# Patient Record
Sex: Male | Born: 1985 | Race: Black or African American | Hispanic: No | Marital: Single | Smoking: Never smoker
Health system: Southern US, Community
[De-identification: ages and names within clinical notes are randomized; demographics above are authoritative.]

---

## 2012-06-03 ENCOUNTER — Ambulatory Visit: Payer: BC Managed Care – PPO | Admitting: Physician Assistant

## 2012-06-03 VITALS — BP 124/74 | HR 89 | Temp 98.3°F | Resp 16 | Ht 66.0 in | Wt 174.0 lb

## 2012-06-03 DIAGNOSIS — J029 Acute pharyngitis, unspecified: Secondary | ICD-10-CM

## 2012-06-03 DIAGNOSIS — J069 Acute upper respiratory infection, unspecified: Secondary | ICD-10-CM

## 2012-06-03 MED ORDER — GUAIFENESIN ER 1200 MG PO TB12: 1.0000 | ORAL_TABLET | Freq: Two times a day (BID) | ORAL | Status: AC

## 2012-06-03 MED ORDER — MAGIC MOUTHWASH W/LIDOCAINE: ORAL | Status: AC

## 2012-06-03 MED ORDER — IPRATROPIUM BROMIDE 0.06 % NA SOLN: 2.0000 | Freq: Three times a day (TID) | NASAL | Status: AC

## 2012-06-03 NOTE — Progress Notes (Signed)
  Subjective:    Patient ID: Jason Mejia, male    DOB: 1986-07-23, 26 y.o.   MRN: [id number]  HPI  Pt presents to clinic with 1 wk h/o cold symptoms.  He started with congestion and chills.  But a couple of days ago started to develop a sore throat and now he is worried about strep.  He has seasonal allergies but this feels different.  He has used a couple of doses of Nyquil without much help/  Has noticed decrease in eating because of the pain in his throat.  His son was sick last week and he works in [employer] ge[employer].   Review of Systems  Constitutional: Positive for chills. Negative for fever.  HENT: Positive for congestion, sore throat (worse in the am - ), rhinorrhea and postnasal drip.   Respiratory: Negative for cough (resolved).   Gastrointestinal: Negative for nausea.  Neurological: Negative for headaches.       Objective:   Physical Exam  Vitals reviewed. Constitutional: He is oriented to person, place, and time. He appears well-developed and well-nourished.  HENT:  Head: Normocephalic and atraumatic.  Right Ear: Hearing, tympanic membrane, external ear and ear canal normal.  Left Ear: Hearing, tympanic membrane, external ear and ear canal normal.  Nose: Mucosal edema (red and swollen) present.  Mouth/Throat: Uvula is midline. Posterior oropharyngeal edema and posterior oropharyngeal erythema present.       Hypertrophic tonsils with bilateral tonsoliths.  Eyes: Conjunctivae normal are normal.  Neck: Neck supple.  Cardiovascular: Normal rate, regular rhythm and normal heart sounds.   No murmur heard. Pulmonary/Chest: Effort normal and breath sounds normal.  Lymphadenopathy:    He has no cervical adenopathy.  Neurological: He is alert and oriented to person, place, and time.  Skin: Skin is warm and dry.  Psychiatric: He has a normal mood and affect. His behavior is normal. Judgment and thought content normal.    Results for orders placed in visit on 06/03/12  POCT RAPID STREP A  (OFFICE)      Component Value Range   Rapid Strep A Screen Negative  Negative         Assessment & Plan:   1. Acute URI  Guaifenesin (MUCINEX MAXIMUM STRENGTH) 1200 MG TB12, ipratropium (ATROVENT) 0.06 % nasal spray  2. Sore throat  POCT rapid strep A, Alum & Mag Hydroxide-Simeth (MAGIC MOUTHWASH W/LIDOCAINE) SOLN

## 2012-06-03 NOTE — Patient Instructions (Signed)
Push fluids. Tylenol/motrin prn myalgias and sore throat pain. Gargle to remove tonsoliths. Call in 1 wk if no better for antibiotics because then a sinus infection is probable.

## 2016-06-09 ENCOUNTER — Emergency Department
Admission: EM | Admit: 2016-06-09 | Discharge: 2016-06-09 | Disposition: A | Discharge status: Home / Self Care | Payer: Self-pay | Attending: Emergency Medicine | Admitting: Emergency Medicine

## 2016-06-09 ENCOUNTER — Emergency Department: Payer: Self-pay

## 2016-06-09 DIAGNOSIS — S61412A Laceration without foreign body of left hand, initial encounter: Secondary | ICD-10-CM

## 2016-06-09 DIAGNOSIS — Y9389 Activity, other specified: Secondary | ICD-10-CM | POA: Insufficient documentation

## 2016-06-09 DIAGNOSIS — S61012A Laceration without foreign body of left thumb without damage to nail, initial encounter: Secondary | ICD-10-CM | POA: Insufficient documentation

## 2016-06-09 DIAGNOSIS — S61219A Laceration without foreign body of unspecified finger without damage to nail, initial encounter: Secondary | ICD-10-CM

## 2016-06-09 DIAGNOSIS — Y9289 Other specified places as the place of occurrence of the external cause: Secondary | ICD-10-CM | POA: Insufficient documentation

## 2016-06-09 DIAGNOSIS — W25XXXA Contact with sharp glass, initial encounter: Secondary | ICD-10-CM | POA: Insufficient documentation

## 2016-06-09 DIAGNOSIS — Y999 Unspecified external cause status: Secondary | ICD-10-CM | POA: Insufficient documentation

## 2016-06-09 DIAGNOSIS — S61411A Laceration without foreign body of right hand, initial encounter: Secondary | ICD-10-CM | POA: Insufficient documentation

## 2016-06-09 MED ORDER — PENTAFLUOROPROP-TETRAFLUOROETH EX AERO
INHALATION_SPRAY | CUTANEOUS | Status: AC
  Filled 2016-06-09: qty 30

## 2016-06-09 MED ORDER — LIDOCAINE HCL (PF) 1 % IJ SOLN
INTRAMUSCULAR | Status: AC
  Filled 2016-06-09: qty 5

## 2016-06-09 MED ORDER — CEPHALEXIN 500 MG PO CAPS: 500.0000 mg | ORAL_CAPSULE | Freq: Two times a day (BID) | ORAL | 0 refills | Status: AC

## 2016-06-09 MED ORDER — BACITRACIN ZINC 500 UNIT/GM EX OINT
TOPICAL_OINTMENT | CUTANEOUS | Status: AC
  Filled 2016-06-09: qty 1.8

## 2016-06-09 MED ORDER — OXYCODONE-ACETAMINOPHEN 5-325 MG PO TABS
1.0000 | ORAL_TABLET | Freq: Once | ORAL | Status: AC
  Administered 2016-06-09: 1 via ORAL

## 2016-06-09 MED ORDER — CEPHALEXIN 500 MG PO CAPS
500.0000 mg | ORAL_CAPSULE | Freq: Once | ORAL | Status: AC
  Administered 2016-06-09: 500 mg via ORAL
  Filled 2016-06-09: qty 1

## 2016-06-09 MED ORDER — OXYCODONE-ACETAMINOPHEN 5-325 MG PO TABS
ORAL_TABLET | ORAL | Status: AC
  Administered 2016-06-09: 1 via ORAL
  Filled 2016-06-09: qty 1

## 2016-06-09 NOTE — ED Triage Notes (Addendum)
Pt reports to ED w/ finger laceration.  Sts that he was reaching for fallen cup and cut L and R hand.  Pt denies CP, SOB, N/V/D, LOC or falls.  Pt alert and oriented x 4.  Pts wounds cleansed and dressed.  R hand edges approximate, L thumb tip does not

## 2016-06-09 NOTE — ED Provider Notes (Signed)
[facility] Emergency Department Provider Note   First MD Initiated Contact with Patient 06/09/16 [phone]     (approximate)  I have reviewed the triage vital signs and the nursing notes.   HISTORY  Chief Complaint Laceration    HPI Jason Mejia is a 30 y.o. male presents with right palm and left thumb laceration which was sustained secondary to broken glass. Patient states current pain score is 10 out of 10.   Past medical history None There are no active problems to display for this patient.   Past surgical history None  Prior to Admission medications   Medication Sig Start Date End Date Taking? Authorizing Provider  Alum & Mag Hydroxide-Simeth (MAGIC MOUTHWASH W/LIDOCAINE) SOLN 1 tsp po q1-2h prn sore throat 06/03/12   Morrell Riddle, PA-C  Guaifenesin [facility] MAXIMUM STRENGTH) 1200 MG TB12 Take 1 tablet (1,200 mg total) by mouth 2 (two) times daily. 06/03/12   Morrell Riddle, PA-C  ipratropium (ATROVENT) 0.06 % nasal spray Place 2 sprays into the nose 3 (three) times daily. 06/03/12   Morrell Riddle, PA-C    Allergies No known drug allergies No family history on file.  Social History Social History  Substance Use Topics  . Smoking status: Never Smoker  . Smokeless tobacco: Never Used  . Alcohol use No    Review of Systems Constitutional: No fever/chills Eyes: No visual changes. ENT: No sore throat. Cardiovascular: Denies chest pain. Respiratory: Denies shortness of breath. Gastrointestinal: No abdominal pain.  No nausea, no vomiting.  No diarrhea.  No constipation. Genitourinary: Negative for dysuria. Musculoskeletal: Negative for back pain. Skin: Negative for rash.Positive for right palm and left thumb lacerations Neurological: Negative for headaches, focal weakness or numbness.  10-point ROS otherwise negative.  ____________________________________________   PHYSICAL EXAM:  VITAL SIGNS: ED Triage Vitals  Enc Vitals Group     BP  06/09/16 0210 133/83     Pulse Rate 06/09/16 0210 67     Resp 06/09/16 0210 18     Temp 06/09/16 0210 98.2 F (36.8 C)     Temp Source 06/09/16 0210 Oral     SpO2 06/09/16 0210 100 %     Weight 06/09/16 0210 175 lb (79.4 kg)     Height 06/09/16 0210 5\' 8"  (1.727 m)     Head Circumference --      Peak Flow --      Pain Score 06/09/16 0211 9     Pain Loc --      Pain Edu? --      Excl. in GC? --     Constitutional: Alert and oriented. Well appearing and in no acute distress. Eyes: Conjunctivae are normal. PERRL. EOMI. Head: Atraumatic. Mouth/Throat: Mucous membranes are moist.  Oropharynx non-erythematous. Neck: No stridor.  No meningeal signs.   Musculoskeletal: No lower extremity tenderness nor edema. No gross deformities of extremities. Neurologic:  Normal speech and language. No gross focal neurologic deficits are appreciated.  Skin:  3 cm knee laceration right palm. 1 cm Skin avulsion noted distal left thumb actively bleeding Psychiatric: Mood and affect are normal. Speech and behavior are normal.  ___________________________________  RADIOLOGY I, Willacy N Rachel Rison, personally viewed and evaluated these images (plain radiographs) as part of my medical decision making, as well as reviewing the written report by the radiologist.  Dg Hand Complete Left  Result Date: 06/09/2016 CLINICAL DATA:  Lacerations to the left thumb from broken glass. EXAM: LEFT HAND - COMPLETE 3+  VIEW COMPARISON:  None. FINDINGS: Gauze material over the left first finger may obscure detail. No radiopaque soft tissue foreign bodies are demonstrated. No acute fracture or dislocation. No focal bone lesion or bone destruction. Bone cortex appears intact. IMPRESSION: Negative. Electronically Signed   By: Burman NievesWilliam  Stevens M.D.   On: 06/09/2016 02:44   Dg Hand Complete Right  Result Date: 06/09/2016 CLINICAL DATA:  Multiple lacerations to the right hand from broken glass. EXAM: RIGHT HAND - COMPLETE 3+ VIEW  COMPARISON:  None. FINDINGS: No radiopaque soft tissue foreign bodies demonstrated. There is no evidence of fracture or dislocation. There is no evidence of arthropathy or other focal bone abnormality. Soft tissues are unremarkable. IMPRESSION: Negative. Electronically Signed   By: Burman NievesWilliam  Stevens M.D.   On: 06/09/2016 02:45    ____________________________________________   PROCEDURES   .Marland Kitchen.Laceration Repair Date/Time: 06/09/2016 7:51 AM Performed by: Darci CurrentBROWN, [place] N Authorized by: Darci CurrentBROWN,  N   Consent:    Consent obtained:  Verbal   Consent given by:  Patient   Risks discussed:  Infection and pain   Alternatives discussed:  No treatment Anesthesia (see MAR for exact dosages):    Anesthesia method:  Local infiltration   Local anesthetic:  Lidocaine 1% w/o epi Laceration details:    Location:  Hand   Hand location:  R palm   Length (cm):  3   Depth (mm):  1 Repair type:    Repair type:  Simple Pre-procedure details:    Preparation:  Patient was prepped and draped in usual sterile fashion Exploration:    Contaminated: no   Treatment:    Area cleansed with:  Saline and Betadine   Amount of cleaning:  Standard   Visualized foreign bodies/material removed: no   Skin repair:    Repair method:  Sutures   Suture size:  5-0   Suture material:  Nylon   Number of sutures:  5 Approximation:    Approximation:  Close Post-procedure details:    Patient tolerance of procedure:  Tolerated well, no immediate complications        INITIAL IMPRESSION / ASSESSMENT AND PLAN / ED COURSE  Pertinent labs & imaging results that were available during my care of the patient were reviewed by me and considered in my medical decision making (see chart for details).   Left thumb laceration not repaired secondary to avulsion injury with inability to close remaining skin border Patient given Keflex for prophylaxis   Clinical Course     ____________________________________________  FINAL CLINICAL IMPRESSION(S) / ED DIAGNOSES  Final diagnoses:  Hand laceration, left, initial encounter  Finger laceration, initial encounter     MEDICATIONS GIVEN DURING THIS VISIT:  Medications  lidocaine (PF) (XYLOCAINE) 1 % injection (not administered)  pentafluoroprop-tetrafluoroeth (GEBAUERS) aerosol (not administered)     NEW OUTPATIENT MEDICATIONS STARTED DURING THIS VISIT:  New Prescriptions   No medications on file    Modified Medications   No medications on file    Discontinued Medications   No medications on file     Note:  This document was prepared using Dragon voice recognition software and may include unintentional dictation errors.    Darci Currentandolph N Liahna Brickner, MD 06/09/16 [phone]

## 2016-06-09 NOTE — ED Notes (Signed)
Reviewed d/c instructions, follow-up care, prescriptions, and suture/wound care with pt. Pt verbalized understanding

## 2018-03-28 IMAGING — CR DG HAND COMPLETE 3+V*R*
1 series · 3 of 3 positions shown · non-contrast
Comparison: None.

CLINICAL DATA: Multiple lacerations to the right hand from broken
glass.

EXAM:
RIGHT HAND - COMPLETE 3+ VIEW

[Series 1: x hand pa right · 0.14mm/px · 3 of 3 slices shown]
[im 1/3]
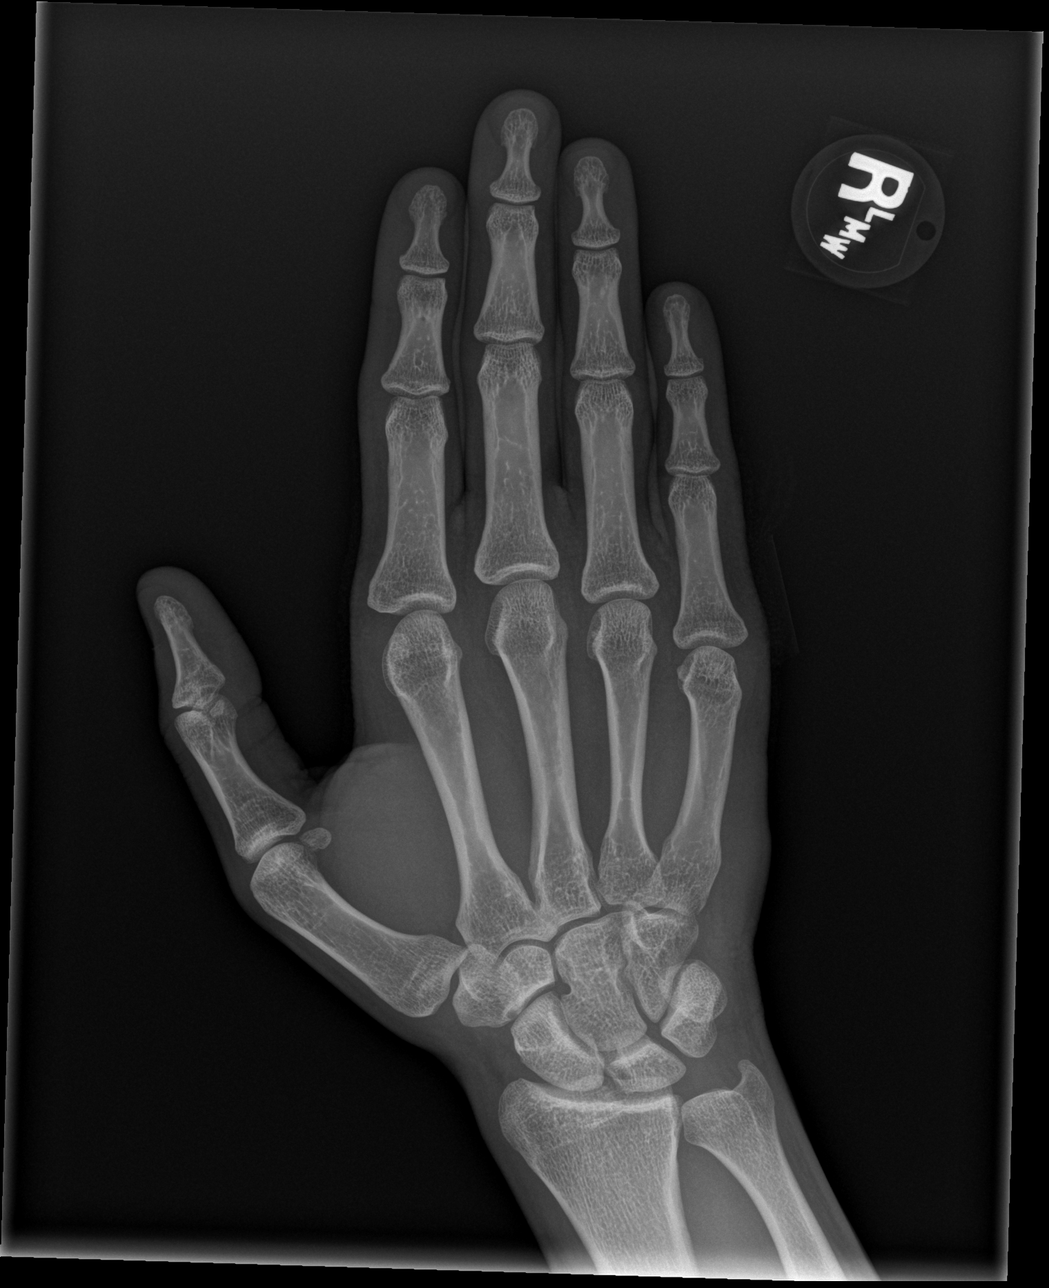
[im 2/3]
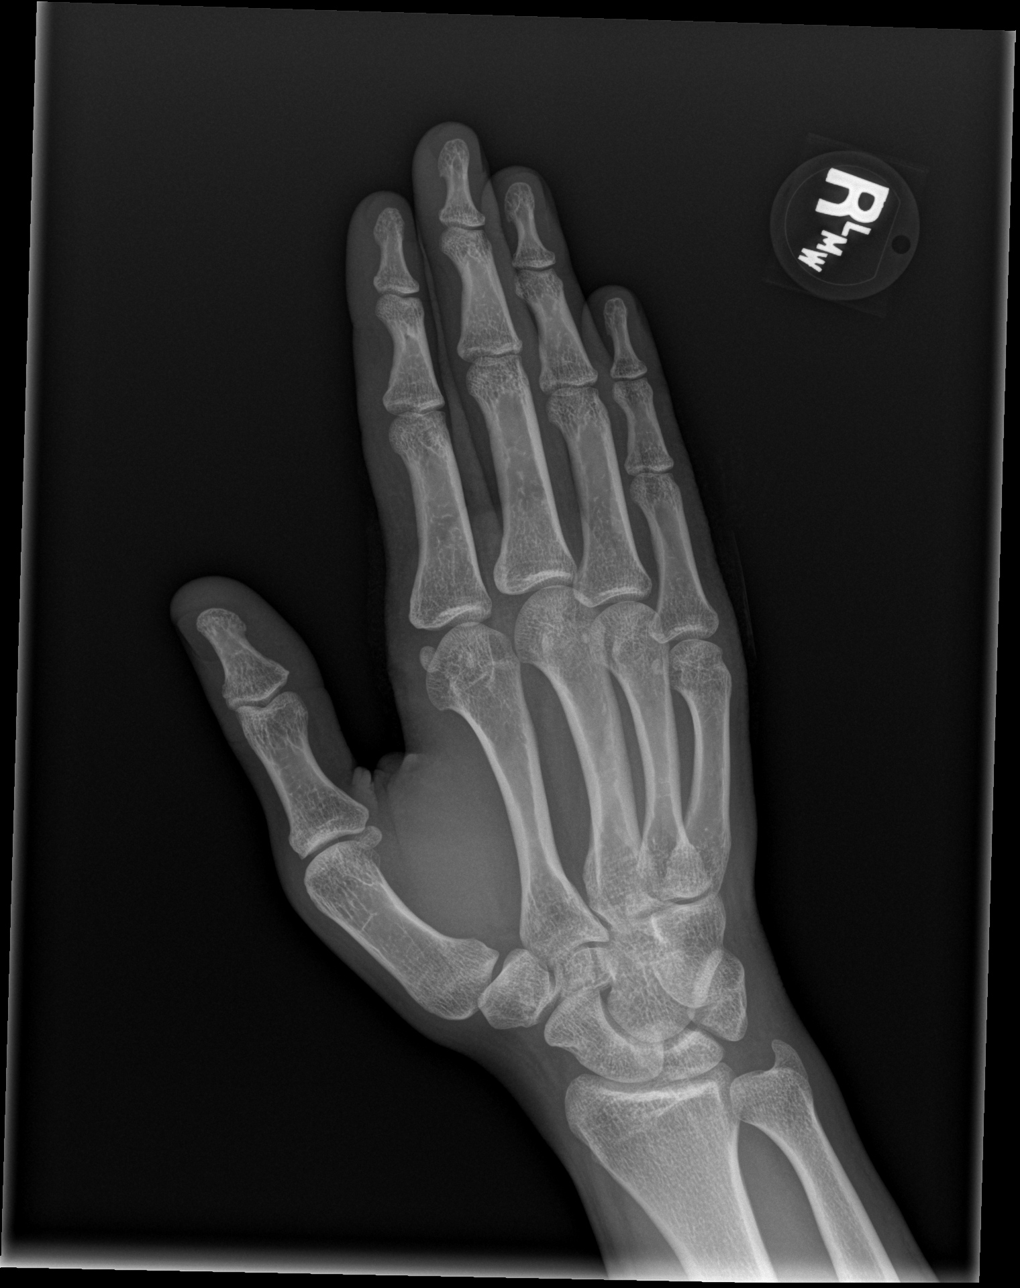
[im 3/3]
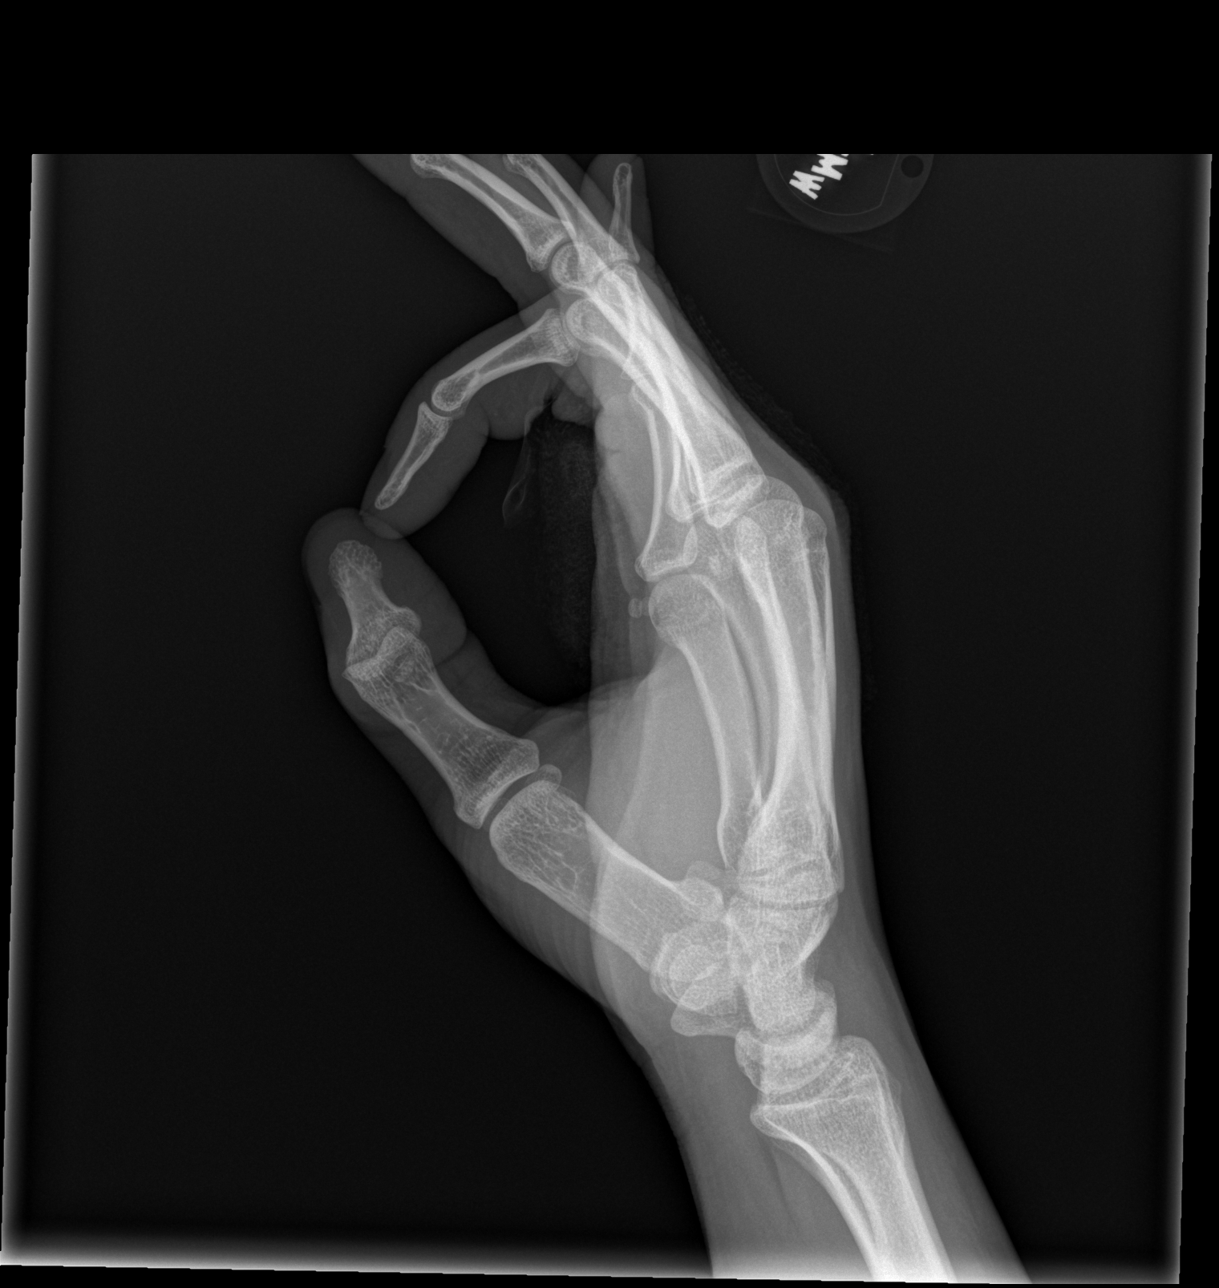

[3 of 3 positions shown; findings below may reference images not displayed]

FINDINGS: No radiopaque soft tissue foreign bodies demonstrated. There is no
evidence of fracture or dislocation. There is no evidence of
arthropathy or other focal bone abnormality. Soft tissues are
unremarkable.
IMPRESSION: Negative.

## 2018-03-28 IMAGING — CR DG HAND COMPLETE 3+V*L*
1 series · 3 of 3 positions shown · non-contrast
Comparison: None.

CLINICAL DATA: Lacerations to the left thumb from broken glass.

EXAM:
LEFT HAND - COMPLETE 3+ VIEW

[Series 1: x hand pa left · 0.14mm/px · 3 of 3 slices shown]
[im 1/3]
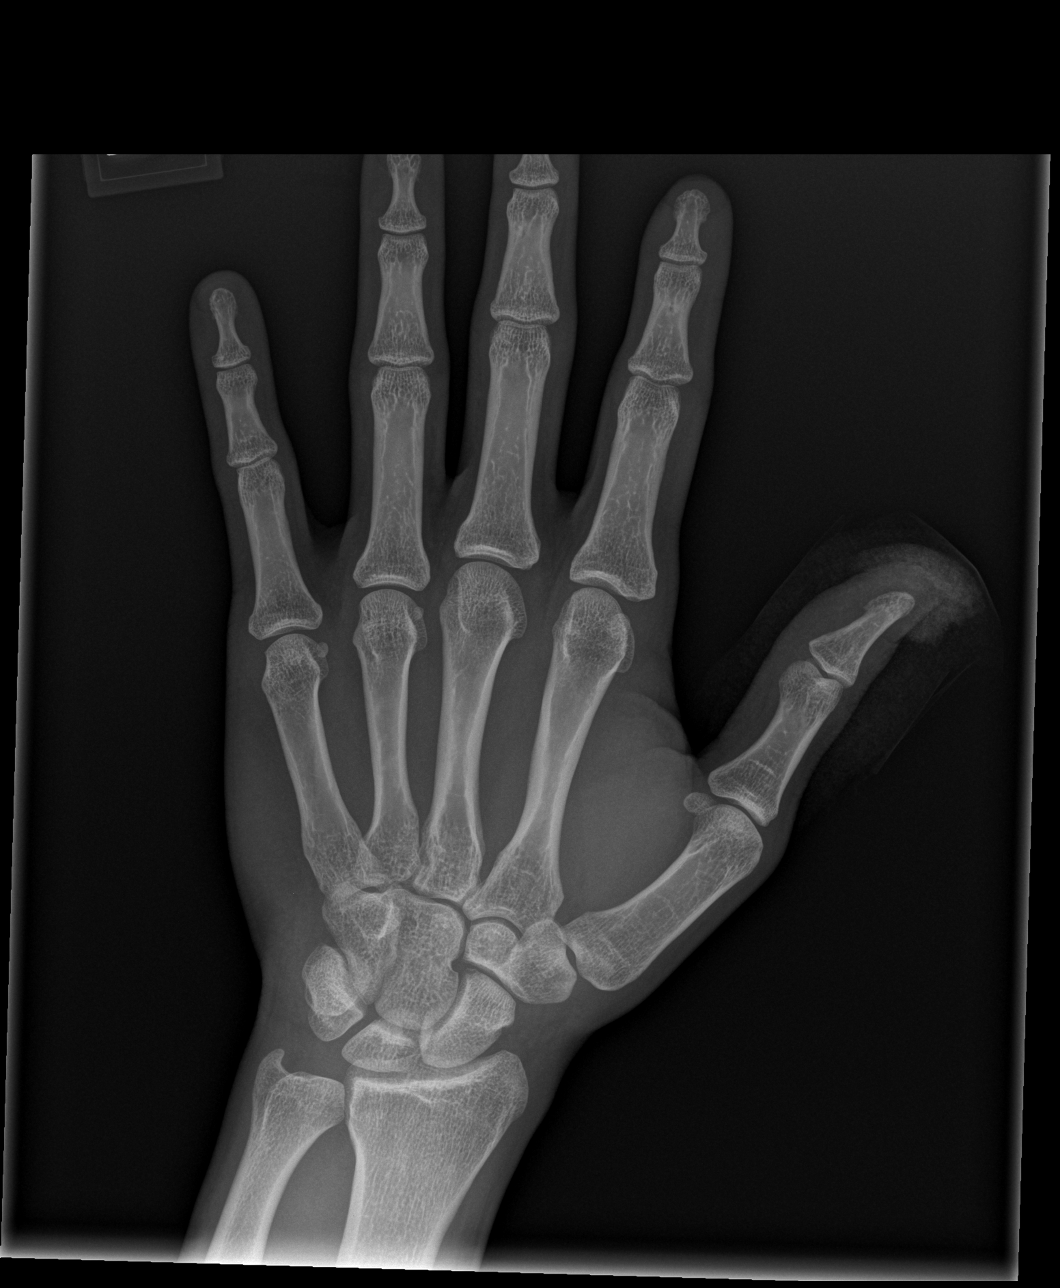
[im 2/3]
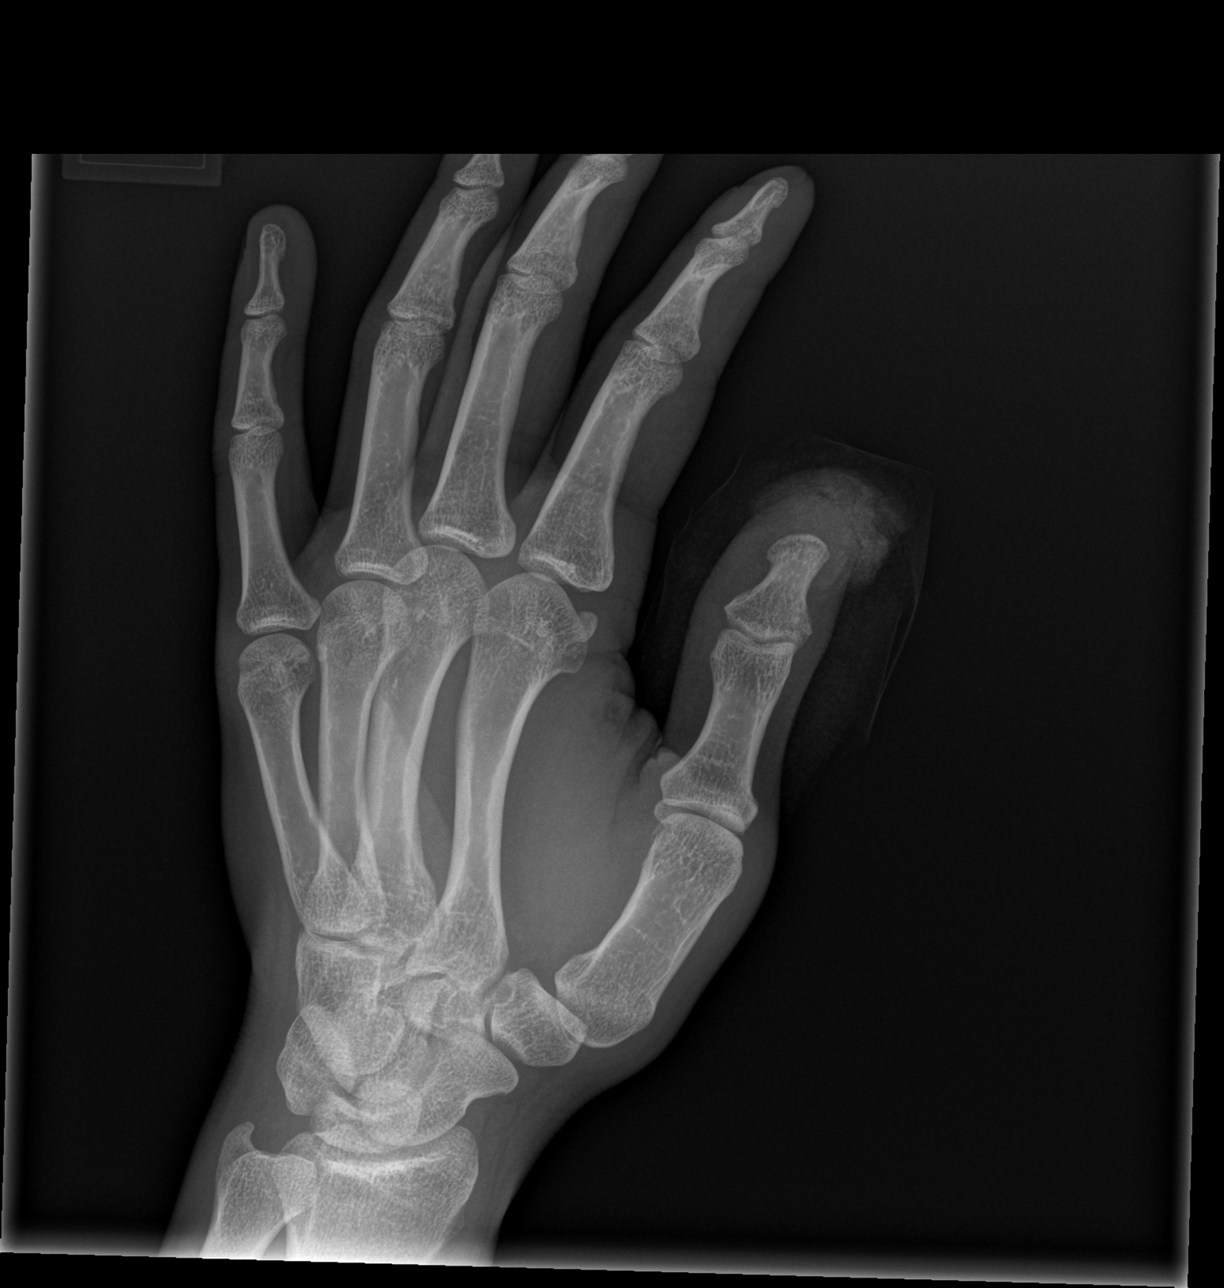
[im 3/3]
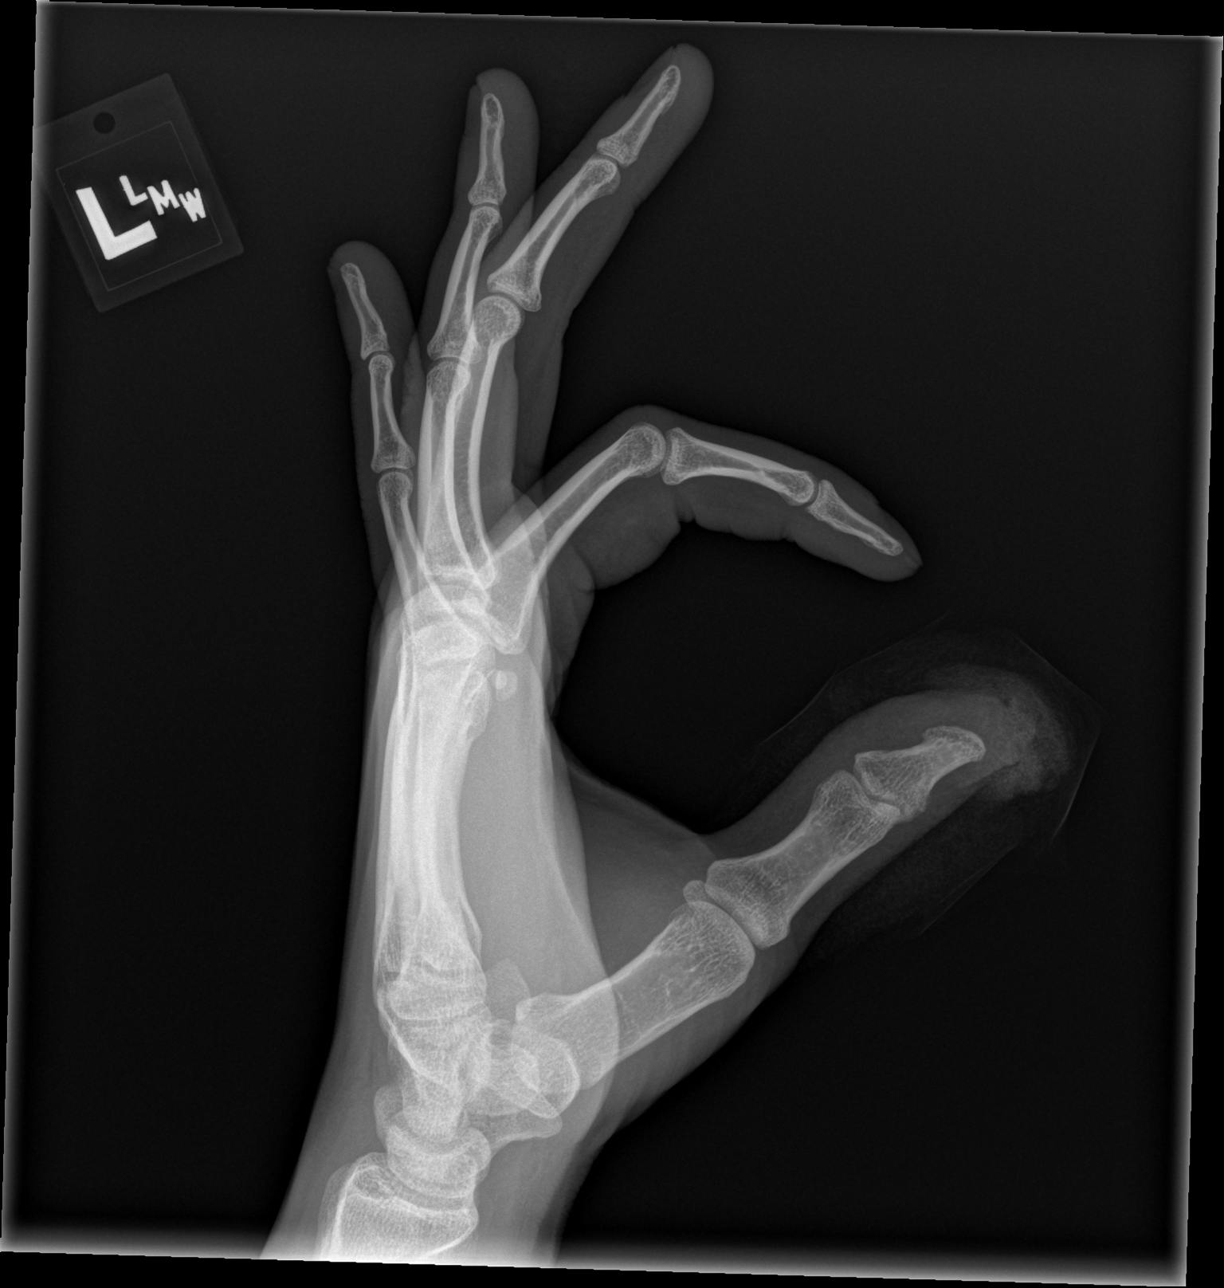

[3 of 3 positions shown; findings below may reference images not displayed]

FINDINGS: Gauze material over the left first finger may obscure detail. No
radiopaque soft tissue foreign bodies are demonstrated. No acute
fracture or dislocation. No focal bone lesion or bone destruction.
Bone cortex appears intact.
IMPRESSION: Negative.
